# Patient Record
Sex: Female | Born: 1980 | Hispanic: No | Marital: Single | Smoking: Never smoker
Health system: Southern US, Community
[De-identification: ages and names within clinical notes are randomized; demographics above are authoritative.]

---

## 2004-02-10 ENCOUNTER — Emergency Department (HOSPITAL_COMMUNITY): Admission: EM | Admit: 2004-02-10 | Discharge: 2004-02-11 | Payer: Self-pay | Admitting: Emergency Medicine

## 2004-02-13 ENCOUNTER — Encounter: Admission: RE | Admit: 2004-02-13 | Discharge: 2004-05-13 | Payer: Self-pay | Admitting: General Practice
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
## 2007-04-08 ENCOUNTER — Emergency Department (HOSPITAL_COMMUNITY): Admission: EM | Admit: 2007-04-08 | Discharge: 2007-04-08 | Payer: Self-pay | Admitting: Emergency Medicine

## 2008-07-08 ENCOUNTER — Inpatient Hospital Stay (HOSPITAL_COMMUNITY): Admission: AD | Admit: 2008-07-08 | Discharge: 2008-07-08 | Payer: Self-pay | Admitting: Obstetrics and Gynecology

## 2008-07-10 ENCOUNTER — Inpatient Hospital Stay (HOSPITAL_COMMUNITY): Admission: AD | Admit: 2008-07-10 | Discharge: 2008-07-13 | Payer: Self-pay | Admitting: Obstetrics and Gynecology

## 2011-03-23 NOTE — Discharge Summary (Signed)
Sherry Meza, Sherry Meza               ACCOUNT NO.:  [id number]   MEDICAL RECORD NO.:  [id number]          PATIENT TYPE:  INP   LOCATION:  9101                          FACILITY:  [facility]   PHYSICIAN:  Guy Sandifer. Henderson Cloud, M.D. DATE OF BIRTH:  30-Oct-1981   DATE OF ADMISSION:  07/10/2008  DATE OF DISCHARGE:  07/13/2008                               DISCHARGE SUMMARY   ADMITTING DIAGNOSIS:  Intrauterine pregnancy at 40-2/7th weeks estimated  gestational age.   DISCHARGE DIAGNOSIS:  Intrauterine pregnancy at 40-2/7th weeks estimated  gestational age, delivered.   PROCEDURE:  On July 10, 2008 low transverse cesarean section.   REASON FOR ADMISSION:  This patient is a 30 year old G1, P0 at 40-2/7th  weeks, who was admitted in labor on July 10, 2008.  Diagnosis of  failure to progress is made and decision to take her to the operating  room is discussed.   HOSPITAL COURSE:  The patient is admitted to the hospital and undergoes  low transverse cesarean section.  This is productive of viable female  infant, Apgars of 8 at 9 minutes and 1 at 5 minutes respectively with an  arterial cord pH of 7.34 and the birth weight of 8 pounds 12 ounces.  On  the first postoperative day, she is afebrile with stable vital signs.  Not yet passing flatus.  Abdomen is soft and nontender.  Hemoglobin is  11.9 and pathology is pending.  She progresses to ambulation passing  flatus and tolerating regular diet.  Vital signs remained stable and she  remains afebrile.   CONDITION ON DISCHARGE:  Good.   DIET:  Regular as tolerated.   ACTIVITY:  No lifting, no operation of automobiles, and no vaginal  entry.  She is to call the office for problems including but not limited  to temperature of 101 degrees, persistent nausea or vomiting, and heavy  bleeding.   MEDICATIONS:  1. Percocet 5/325 mg, #30 1-2 p.o. q.6 h. p.r.n.  2. Ibuprofen 600 mg q.6 h. p.r.n.  3. Prenatal vitamins daily.  4. Follow up in the office  in 2 weeks.      Guy Sandifer Henderson Cloud, M.D.  Electronically Signed     JET/MEDQ  D:  07/13/2008  T:  07/13/2008  Job:  [id number]

## 2011-03-23 NOTE — Op Note (Signed)
Sherry Meza, Sherry Meza               ACCOUNT NO.:  [id number]   MEDICAL RECORD NO.:  [id number]          PATIENT TYPE:  INP   LOCATION:  9101                          [facility]ILITY[facility]   PHYSICIAN:  Dineen Kid. Rana Snare, M.D.    DATE OF BIRTH:  Mar 28, 1981   DATE OF PROCEDURE:  DATE OF DISCHARGE:                               OPERATIVE REPORT   PREOPERATIVE DIAGNOSES:  1. Intrauterine pregnancy at 40 and 2/7th weeks.  2. Fetal intolerance to labor.  3. Failure to progress.   POSTOPERATIVE DIAGNOSES:  1. Intrauterine pregnancy at 40 and 2/7th weeks.  2. Fetal intolerance to labor.  3. Failure to progress.  4. Macrosomia.   PROCEDURES:  Primary low-segment transverse cesarean section.   SURGEON:  Dineen Kid. Rana Snare, MD   ANESTHESIA:  Epidural.   INDICATIONS:  Ms. Lahmann presents today in early labor.  She progressed  to 6 cm, had no further dilation beyond 6 cm.  After placing  intrauterine pressure catheter, was only having 150-180 [place] units  of pressure; however, began experiencing fetal tachycardia, decreased  beat-to-beat, and moderate variables responded to discontinuing the  Pitocin, which was used for augmentation.  Baby responded appropriately;  however, made no further progress.  Whenever we began Pitocin continued  to have B cells.  Because of failure to progress and fetal intolerance  to labor, planned primary low-segment transverse cesarean section.  Risks and benefits were discussed.  Informed consent was obtained.   FINDINGS AT THE TIME OF SURGERY:  Viable female infant, Apgars were 8  and 9, pH arterial 7.34, weight is 8 pounds 12 ounces.  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   DESCRIPTION OF PROCEDURE:  After adequate analgesia, the patient placed  in the supine position with left lateral tilt.  She is sterilely prepped  and draped.  Bladder sterilely drained with a Foley catheter.  A  Pfannenstiel skin incision was made 2 fingerbreadths above the pubic  symphysis, taken down sharply to the fascia, which was incised  transversely and extended superiorly and inferiorly off the bellies  rectus muscle, which was separated sharply in midline.  Peritoneum was  entered sharply.  Bladder flap created and placed behind the bladder  blade.  Low-segment myotomy incision was made down to the infant's  vertex and extended laterally with the operator's fingertips.  Infant's  vertex was delivered atraumatically.  The nares and pharynx was  suctioned.  Nuchal cord x1 was reduced.  The infant was then delivered,  cord clamped, cut, and handed to pediatrician for resuscitation.  Cord  blood was obtained.  Placenta extracted manually.  The uterus was  exteriorized, wiped and clean with a dry lap.  The myotomy incision  closed in two layers, first being a running locking layer, second being  imbricating layer of 0 Monocryl suture.  The uterus was placed back into  the peritoneal cavity and after copious amount of irrigation and  adequate hemostasis was assured, the peritoneum was closed with 0  Monocryl.  Rectus muscle plicated to the midline with 0 Monocryl suture.  Irrigation was applied and  after adequate hemostasis, the fascia was  then closed with a #1 Vicryl in running fashion.  Irrigation was applied  and after adequate hemostasis, skin staples and Steri-Strips applied.  The patient tolerated procedure well, was stable on transfer to recovery  room.  Sponge count was normal x3.  Estimated blood loss 500 mL.  The  patient received 1 g of cefotetan after delivery of placenta.      Dineen Kid Rana Snare, M.D.  Electronically Signed     DCL/MEDQ  D:  07/11/2008  T:  07/11/2008  Job:  [id number]

## 2011-08-11 LAB — CBC
HCT: 40.2
Hemoglobin: 13.4
MCHC: 32.9
MCHC: 33.4
MCV: 93.3
RBC: 3.86 — ABNORMAL LOW
RBC: 4.37
RDW: 14
WBC: 9.3

## 2011-10-06 ENCOUNTER — Other Ambulatory Visit: Payer: Self-pay | Admitting: Otolaryngology

## 2011-10-06 DIAGNOSIS — H905 Unspecified sensorineural hearing loss: Secondary | ICD-10-CM

## 2011-10-11 ENCOUNTER — Ambulatory Visit
Admission: RE | Admit: 2011-10-11 | Discharge: 2011-10-11 | Disposition: A | Discharge status: Home / Self Care | Payer: Private Health Insurance - Indemnity | Source: Ambulatory Visit | Attending: Otolaryngology | Admitting: Otolaryngology

## 2011-10-11 DIAGNOSIS — H905 Unspecified sensorineural hearing loss: Secondary | ICD-10-CM

## 2011-10-11 MED ORDER — GADOBENATE DIMEGLUMINE 529 MG/ML IV SOLN
10.0000 mL | Freq: Once | INTRAVENOUS | Status: AC | PRN
  Administered 2011-10-11: 10 mL via INTRAVENOUS

## 2014-09-03 ENCOUNTER — Other Ambulatory Visit: Payer: Self-pay | Admitting: Obstetrics and Gynecology

## 2014-09-03 DIAGNOSIS — N644 Mastodynia: Secondary | ICD-10-CM

## 2014-09-12 ENCOUNTER — Inpatient Hospital Stay: Admission: RE | Admit: 2014-09-12 | Payer: Private Health Insurance - Indemnity | Source: Ambulatory Visit

## 2020-11-08 HISTORY — PX: OTHER SURGICAL HISTORY: SHX169

## 2021-12-03 ENCOUNTER — Other Ambulatory Visit: Payer: Self-pay

## 2021-12-03 ENCOUNTER — Other Ambulatory Visit: Payer: 59 | Admitting: Internal Medicine

## 2021-12-03 DIAGNOSIS — Z0000 Encounter for general adult medical examination without abnormal findings: Secondary | ICD-10-CM

## 2021-12-03 DIAGNOSIS — Z13220 Encounter for screening for lipoid disorders: Secondary | ICD-10-CM

## 2021-12-03 LAB — CBC WITH DIFFERENTIAL/PLATELET
Absolute Monocytes: 600 cells/uL (ref 200–950)
Basophils Absolute: 12 cells/uL (ref 0–200)
Basophils Relative: 0.2 %
Eosinophils Absolute: 180 cells/uL (ref 15–500)
Eosinophils Relative: 3 %
HCT: 39.3 % (ref 35.0–45.0)
Hemoglobin: 12.8 g/dL (ref 11.7–15.5)
Lymphs Abs: 3222 cells/uL (ref 850–3900)
MCH: 29 pg (ref 27.0–33.0)
MCHC: 32.6 g/dL (ref 32.0–36.0)
MCV: 89.1 fL (ref 80.0–100.0)
MPV: 12.2 fL (ref 7.5–12.5)
Monocytes Relative: 10 %
Neutro Abs: 1986 cells/uL (ref 1500–7800)
Neutrophils Relative %: 33.1 %
Platelets: 252 10*3/uL (ref 140–400)
RBC: 4.41 10*6/uL (ref 3.80–5.10)
RDW: 12.7 % (ref 11.0–15.0)
Total Lymphocyte: 53.7 %
WBC: 6 10*3/uL (ref 3.8–10.8)

## 2021-12-03 LAB — COMPLETE METABOLIC PANEL WITH GFR
AG Ratio: 1.4 (calc) (ref 1.0–2.5)
ALT: 31 U/L — ABNORMAL HIGH (ref 6–29)
AST: 23 U/L (ref 10–30)
Albumin: 4 g/dL (ref 3.6–5.1)
Alkaline phosphatase (APISO): 53 U/L (ref 31–125)
BUN: 7 mg/dL (ref 7–25)
CO2: 29 mmol/L (ref 20–32)
Calcium: 9.1 mg/dL (ref 8.6–10.2)
Chloride: 105 mmol/L (ref 98–110)
Creat: 0.71 mg/dL (ref 0.50–0.99)
Globulin: 2.9 g/dL (calc) (ref 1.9–3.7)
Glucose, Bld: 78 mg/dL (ref 65–99)
Potassium: 4.4 mmol/L (ref 3.5–5.3)
Sodium: 140 mmol/L (ref 135–146)
Total Bilirubin: 0.8 mg/dL (ref 0.2–1.2)
Total Protein: 6.9 g/dL (ref 6.1–8.1)
eGFR: 110 mL/min/{1.73_m2} (ref >=60)

## 2021-12-03 LAB — LIPID PANEL
Cholesterol: 187 mg/dL (ref <200)
HDL: 50 mg/dL (ref >=50)
LDL Cholesterol (Calc): 114 mg/dL (calc) — ABNORMAL HIGH
Non-HDL Cholesterol (Calc): 137 mg/dL (calc) — ABNORMAL HIGH (ref <130)
Total CHOL/HDL Ratio: 3.7 (calc) (ref <5.0)
Triglycerides: 118 mg/dL (ref <150)

## 2021-12-10 ENCOUNTER — Encounter: Payer: Self-pay | Admitting: Internal Medicine

## 2021-12-10 ENCOUNTER — Ambulatory Visit: Payer: 59 | Admitting: Internal Medicine

## 2021-12-10 ENCOUNTER — Other Ambulatory Visit: Payer: Self-pay

## 2021-12-10 VITALS — BP 128/90 | HR 84 | Temp 97.8°F | Ht 60.0 in | Wt 150.5 lb

## 2021-12-10 DIAGNOSIS — Z0000 Encounter for general adult medical examination without abnormal findings: Secondary | ICD-10-CM | POA: Diagnosis not present

## 2021-12-10 DIAGNOSIS — I493 Ventricular premature depolarization: Secondary | ICD-10-CM

## 2021-12-10 DIAGNOSIS — Z23 Encounter for immunization: Secondary | ICD-10-CM | POA: Diagnosis not present

## 2021-12-10 DIAGNOSIS — Z8249 Family history of ischemic heart disease and other diseases of the circulatory system: Secondary | ICD-10-CM

## 2021-12-10 DIAGNOSIS — Z6829 Body mass index (BMI) 29.0-29.9, adult: Secondary | ICD-10-CM

## 2021-12-10 NOTE — Patient Instructions (Addendum)
Tetanus immunization update given today.    I have noticed a slight irregular heartbeat on cardiac exam.  We have ordered a Holter monitor through [facility] on [facility] in   [place].  They should contact you about this in the next 3 weeks.  Continue your regular follow-up with OB/GYN. I suspect these are infrequent PVCs which can be caused by decongestants, caffeine and stress.  It was a pleasure to see you today.  Patient appeared to have infrequent PVCs on cardiac monitor in office but EKG did not capture this today.
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
## 2022-02-01 ENCOUNTER — Ambulatory Visit (INDEPENDENT_AMBULATORY_CARE_PROVIDER_SITE_OTHER): Payer: 59

## 2022-02-01 ENCOUNTER — Other Ambulatory Visit: Payer: Self-pay

## 2022-02-01 ENCOUNTER — Other Ambulatory Visit: Payer: Self-pay | Admitting: Internal Medicine

## 2022-02-01 DIAGNOSIS — I493 Ventricular premature depolarization: Secondary | ICD-10-CM

## 2022-02-01 DIAGNOSIS — I499 Cardiac arrhythmia, unspecified: Secondary | ICD-10-CM

## 2022-02-01 NOTE — Progress Notes (Unsigned)
Enrolled for [employer] to mail a ZIO XT long term holter monitor to the patients address on file.  

## 2022-02-04 DIAGNOSIS — I499 Cardiac arrhythmia, unspecified: Secondary | ICD-10-CM | POA: Diagnosis not present

## 2022-02-04 DIAGNOSIS — I493 Ventricular premature depolarization: Secondary | ICD-10-CM | POA: Diagnosis not present

## 2022-03-09 ENCOUNTER — Encounter: Payer: Self-pay | Admitting: Internal Medicine

## 2022-03-09 ENCOUNTER — Telehealth: Payer: Self-pay | Admitting: Internal Medicine

## 2022-03-09 ENCOUNTER — Ambulatory Visit: Payer: 59 | Admitting: Internal Medicine

## 2022-03-09 VITALS — BP 112/88 | HR 81 | Temp 98.4°F | Ht 60.0 in | Wt 151.2 lb

## 2022-03-09 DIAGNOSIS — Z8249 Family history of ischemic heart disease and other diseases of the circulatory system: Secondary | ICD-10-CM | POA: Diagnosis not present

## 2022-03-09 DIAGNOSIS — R5383 Other fatigue: Secondary | ICD-10-CM | POA: Diagnosis not present

## 2022-03-09 NOTE — Telephone Encounter (Signed)
-----   Message from Jama Flavors, CMA sent at 03/09/2022  9:36 AM EDT ----- ?Please get scheduled. ? ?----- Message ----- ?From: Margaree Mackintosh, MD ?Sent: 03/02/2022   3:53 PM EDT ?To: Jama Flavors, CMA ? ?Needs OV to go over  ZIO monitor results ?----- Message ----- ?From: Theresia Majors, RN ?Sent: 03/02/2022   7:32 AM EDT ?To: Margaree Mackintosh, MD ? ? ? ?

## 2022-03-09 NOTE — Patient Instructions (Signed)
Referral to Cardiology regarding PVCs, bigeminy and trigeminy.  She has an appointment tomorrow.  Free T4 and TSH drawn today. ?

## 2022-03-09 NOTE — Telephone Encounter (Signed)
LVM to CB and schedule appointment 

## 2022-03-09 NOTE — Progress Notes (Signed)
? ?  Subjective:  ? ? Patient ID: Sherry Meza, female    DOB: 1981-06-02, 41 y.o.   MRN: [id number] ? ?HPI 41 year old Female seen regarding Zio monitor results.  She presented to the office for the first 10 on February 2 for health maintenance exam. ? ?On February 2, she was noted to have arrhythmia on cardiac exam.  EKG showed intraventricular conduction delay.  She is asymptomatic.  Occasionally aware of palpitations but does not affect her activities.  Denies chest pain. ? ?A Zio monitor was ordered showing 1 run of V. tach of 5 beats.  9 supraventricular tachycardia runs the longest lasting 1 minute 3 seconds.  Rare PACs.  Rare couplets.  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 ? ?Her general health is excellent.  She is a non-smoker.  Social alcohol consumption.  No known drug allergies.  Had C-section in 2009 by Dr. Julien Girt and a laparoscopic myomectomy for fibroids in [place] in October 2022. ? ?Social history: She is a native of [place].  She came to this country in 1996 when she was 41 years old.  She is single.  Completed 4 years of college.   Resides with teenage daughter.  She works for [employer]. ? ?Family history: Father has history of ventricular tachycardia and is seen in [facility] by Dr. Johney Frame.  He also has history of diabetes, hyperlipidemia, chronic kidney disease and hypertension. ? ? ? ?Review of Systems see above-denies chest pain or shortness of breath.  Occasionally will feel the palpitations but not always. ? ?   ?Objective:  ? Physical Exam ? ? BP 112/88, pulse 81, T 98.4 degrees, pulse ox 99% ? ?Skin is warm and dry.  Chest clear to auscultation.  Cardiac exam reveals mostly regular rate and rhythm but will have extrasystoles.  No murmur is present. ? ? ?   ?Assessment & Plan:  ?Episodes of bigeminy and trigeminy with family history of V. tach in her father ? ?Plan: Patient will be referred to Cardiology for evaluation and treatment.  Free T4 and TSH drawn. ? ?

## 2022-03-09 NOTE — Telephone Encounter (Signed)
CB and scheduled 

## 2022-03-10 ENCOUNTER — Encounter: Payer: Self-pay | Admitting: Internal Medicine

## 2022-03-10 ENCOUNTER — Ambulatory Visit (INDEPENDENT_AMBULATORY_CARE_PROVIDER_SITE_OTHER): Payer: 59 | Admitting: Internal Medicine

## 2022-03-10 VITALS — BP 116/72 | HR 74 | Ht 60.0 in | Wt 152.6 lb

## 2022-03-10 DIAGNOSIS — I493 Ventricular premature depolarization: Secondary | ICD-10-CM | POA: Diagnosis not present

## 2022-03-10 DIAGNOSIS — I498 Other specified cardiac arrhythmias: Secondary | ICD-10-CM | POA: Diagnosis not present

## 2022-03-10 LAB — TSH: TSH: 2.13 mIU/L

## 2022-03-10 LAB — T4, FREE: Free T4: 1 ng/dL (ref 0.8–1.8)

## 2022-03-10 NOTE — Patient Instructions (Signed)
Medication Instructions:  ?No Changes In Medications at this time.  ?*If you need a refill on your cardiac medications before your next appointment, please call your pharmacy* ? ?Lab Work: ?None Ordered At This Time.  ?If you have labs (blood work) drawn today and your tests are completely normal, you will receive your results only by: ?MyChart Message (if you have MyChart) OR ?A paper copy in the mail ?If you have any lab test that is abnormal or we need to change your treatment, we will call you to review the results. ? ?Testing/Procedures: ?Your physician has requested that you have an echocardiogram. Echocardiography is a painless test that uses sound waves to create images of your heart. It provides your doctor with information about the size and shape of your heart and how well your heart?s chambers and valves are working. You may receive an ultrasound enhancing agent through an IV if needed to better visualize your heart during the echo.This procedure takes approximately one hour. There are no restrictions for this procedure. This will take place at the [address].  ? ?Follow-Up: ?At [facility], you and your health needs are our priority.  As part of our continuing mission to provide you with exceptional heart care, we have created designated Provider Care Teams.  These Care Teams include your primary Cardiologist (physician) and Advanced Practice Providers (APPs -  Physician Assistants and Nurse Practitioners) who all work together to provide you with the care you need, when you need it. ? ?Your next appointment:   ?3 month(s) ? ?The format for your next appointment:   ?In Person ? ?Provider:   ?Janina Mayo, MD   ? ? ? ? ?  ?

## 2022-03-10 NOTE — Progress Notes (Signed)
?Cardiology Office Note:   ? ?Date:  03/10/2022  ? ?ID:  Sherry Meza, DOB 02-21-1981, MRN [id number] ? ?PCP:  Margaree Mackintosh, MD ?  ?[facility] Providers ?Cardiologist:  Maisie Fus, MD    ? ?Referring MD: Margaree Mackintosh, MD  ? ?No chief complaint on file. ?Ectopy ? ?History of Present Illness:   ? ?Sherry Meza is a 41 y.o. female with a hx of ectopy ? ?Had a ziopatch that showed rare bigeminy and trigeminy. She had many triggered events that were predominantly sinus. EKG in February was normal. Father had Vtach? ; unknown etiology. She had 1 run of NSVT ? ?She denies chest pressure or SOB. Occasional sharp breast pain. She has occasional LH. No syncope. Normal pregnancy. ? ?She says her father has heart issues. Unknown hx. MGM has heart disease. Her cousin  had heart surgery at birth. No SCD.  ? ?TSH normal ? ?Blood pressures is normal ? ? ?Past Surgical History:  ?Procedure Laterality Date  ? CESAREAN SECTION  2009  ? uterine fibroid removed  2022  ? ? ?Current Medications: ?Current Meds  ?Medication Sig  ? Ascorbic Acid (VITAMIN C PO) Take 1 tablet by mouth daily.  ? Multiple Vitamin (MULTIVITAMIN) tablet Take 1 tablet by mouth daily.  ? NON FORMULARY Prenatal with iron  ?  ? ?Allergies:   Patient has no known allergies.  ? ?Social History  ? ?Socioeconomic History  ? Marital status: Single  ?  Spouse name: Not on file  ? Number of children: Not on file  ? Years of education: Not on file  ? Highest education level: Not on file  ?Occupational History  ? Not on file  ?Tobacco Use  ? Smoking status: Never  ? Smokeless tobacco: Never  ?Substance and Sexual Activity  ? Alcohol use: Not on file  ? Drug use: Not on file  ? Sexual activity: Not on file  ?Other Topics Concern  ? Not on file  ?Social History Narrative  ? Not on file  ? ?Social Determinants of Health  ? ?Financial Resource Strain: Not on file  ?Food Insecurity: Not on file  ?Transportation Needs: Not on file  ?Physical Activity: Not on file   ?Stress: Not on file  ?Social Connections: Not on file  ?  ? ?Family History: ?The patient's family history includes Diabetes in her father; Hypertension in her father. ? ?ROS:   ?Please see the history of present illness.    ? All other systems reviewed and are negative. ? ?EKGs/Labs/Other Studies Reviewed:   ? ?The following studies were reviewed today: ? ? ?EKG:  EKG is  ordered today.  The ekg ordered today demonstrates  ? ?5/3-NSR, no ectopy ? ?Recent Labs: ?12/03/2021: ALT 31; BUN 7; Creat 0.71; Hemoglobin 12.8; Platelets 252; Potassium 4.4; Sodium 140 ?03/09/2022: TSH 2.13  ?Recent Lipid Panel ?   ?Component Value Date/Time  ? CHOL 187 12/03/2021 0938  ? TRIG 118 12/03/2021 0938  ? HDL 50 12/03/2021 0938  ? CHOLHDL 3.7 12/03/2021 0938  ? LDLCALC 114 (H) 12/03/2021 0263  ? ? ? ?Risk Assessment/Calculations:   ?  ? ?    ? ?Physical Exam:   ? ?VS: ? ?Vitals:  ? 03/10/22 0828  ?BP: 116/72  ?Pulse: 74  ?SpO2: 99%  ? ? ? ?Wt Readings from Last 3 Encounters:  ?03/10/22 152 lb 9.6 oz (69.2 kg)  ?03/09/22 151 lb 4 oz (68.6 kg)  ?12/10/21 150 lb 8 oz (68.3 kg)  ?  ? ?  GEN:  Well nourished, well developed in no acute distress ?HEENT: Normal ?NECK: No JVD; No carotid bruits ?LYMPHATICS: No lymphadenopathy ?CARDIAC: RRR, no murmurs, rubs, gallops ?RESPIRATORY:  Clear to auscultation without rales, wheezing or rhonchi  ?ABDOMEN: Soft, non-tender, non-distended ?MUSCULOSKELETAL:  No edema; No deformity  ?SKIN: Warm and dry ?NEUROLOGIC:  Alert and oriented x 3 ?PSYCHIATRIC:  Normal affect  ? ?ASSESSMENT:   ? ?Ectopy: she is low risk for ischemic disease and symptoms atypical. Father's heart disease is unknown. EKG is unremarkable. Her ectopy was minimal. Recommended increased consumption of electrolytes. Otherwise, will obtain a TTE to ensure she has no structural heart disease. ? ?PLAN:   ? ?In order of problems listed above: ? ?TTE ?Follow up in 3 months ? ?   ? ?  ?Medication Adjustments/Labs and Tests Ordered: ?Current  medicines are reviewed at length with the patient today.  Concerns regarding medicines are outlined above.  ?Orders Placed This Encounter  ?Procedures  ? EKG 12-Lead  ? ECHOCARDIOGRAM COMPLETE  ? ?No orders of the defined types were placed in this encounter. ? ? ?Patient Instructions  ?Medication Instructions:  ?No Changes In Medications at this time.  ?*If you need a refill on your cardiac medications before your next appointment, please call your pharmacy* ? ?Lab Work: ?None Ordered At This Time.  ?If you have labs (blood work) drawn today and your tests are completely normal, you will receive your results only by: ?MyChart Message (if you have MyChart) OR ?A paper copy in the mail ?If you have any lab test that is abnormal or we need to change your treatment, we will call you to review the results. ? ?Testing/Procedures: ?Your physician has requested that you have an echocardiogram. Echocardiography is a painless test that uses sound waves to create images of your heart. It provides your doctor with information about the size and shape of your heart and how well your heart?s chambers and valves are working. You may receive an ultrasound enhancing agent through an IV if needed to better visualize your heart during the echo.This procedure takes approximately one hour. There are no restrictions for this procedure. This will take place at the [address], [address].  ? ?Follow-Up: ?At [facility], you and your health needs are our priority.  As part of our continuing mission to provide you with exceptional heart care, we have created designated Provider Care Teams.  These Care Teams include your primary Cardiologist (physician) and Advanced Practice Providers (APPs -  Physician Assistants and Nurse Practitioners) who all work together to provide you with the care you need, when you need it. ? ?Your next appointment:   ?3 month(s) ? ?The format for your next appointment:   ?In Person ? ?Provider:   ?Maisie Fus, MD   ? ? ? ? ?   ? ?Signed, ?Maisie Fus, MD  ?03/10/2022 8:52 AM    ?[facility] ?

## 2022-03-24 ENCOUNTER — Ambulatory Visit (HOSPITAL_COMMUNITY): Payer: 59 | Attending: Cardiology

## 2022-03-24 DIAGNOSIS — I493 Ventricular premature depolarization: Secondary | ICD-10-CM | POA: Diagnosis not present

## 2022-03-24 DIAGNOSIS — I358 Other nonrheumatic aortic valve disorders: Secondary | ICD-10-CM | POA: Diagnosis not present

## 2022-03-24 DIAGNOSIS — I498 Other specified cardiac arrhythmias: Secondary | ICD-10-CM | POA: Diagnosis not present

## 2022-03-24 LAB — ECHOCARDIOGRAM COMPLETE
Area-P 1/2: 4.13 cm2
S' Lateral: 2.3 cm

## 2022-06-15 NOTE — Progress Notes (Deleted)
Cardiology Office Note:    Date:  06/15/2022   ID:  [name], [name] Aug 29, 1981, MRN [id number]  PCP:  Margaree Mackintosh, MD   [facility] Providers Cardiologist:  Maisie Fus, MD     Referring MD: Margaree Mackintosh, MD   No chief complaint on file. Ectopy  History of Present Illness:    Sherry Meza is a 41 y.o. female with a hx of ectopy  Had a ziopatch that showed rare bigeminy and trigeminy. She had many triggered events that were predominantly sinus. EKG in February was normal. Father had Vtach? ; unknown etiology. She had 1 run of NSVT  She denies chest pressure or SOB. Occasional sharp breast pain. She has occasional LH. No syncope. Normal pregnancy.  She says her father has heart issues. Unknown hx. MGM has heart disease. Her cousin  had heart surgery at birth. No SCD.   TSH normal  Blood pressures is normal  Interim Hx 8/9: Sherry Meza underwent an echo which showed normal EF and strain. No valve disease. No pulmonary HTN.   Past Surgical History:  Procedure Laterality Date   CESAREAN SECTION  2009   uterine fibroid removed  2022    Current Medications: No outpatient medications have been marked as taking for the 06/16/22 encounter (Appointment) with Maisie Fus, MD.     Allergies:   Patient has no known allergies.   Social History   Socioeconomic History   Marital status: Single    Spouse name: Not on file   Number of children: Not on file   Years of education: Not on file   Highest education level: Not on file  Occupational History   Not on file  Tobacco Use   Smoking status: Never   Smokeless tobacco: Never  Substance and Sexual Activity   Alcohol use: Not on file   Drug use: Not on file   Sexual activity: Not on file  Other Topics Concern   Not on file  Social History Narrative   Not on file   Social Determinants of Health   Financial Resource Strain: Not on file  Food Insecurity: Not on file  Transportation Needs: Not on file   Physical Activity: Not on file  Stress: Not on file  Social Connections: Not on file     Family History: The patient's family history includes Diabetes in her father; Hypertension in her father.  ROS:   Please see the history of present illness.     All other systems reviewed and are negative.  EKGs/Labs/Other Studies Reviewed:    The following studies were reviewed today:   EKG:  EKG is  ordered today.  The ekg ordered today demonstrates   5/3-NSR, no ectopy  Recent Labs: 12/03/2021: ALT 31; BUN 7; Creat 0.71; Hemoglobin 12.8; Platelets 252; Potassium 4.4; Sodium 140 03/09/2022: TSH 2.13  Recent Lipid Panel    Component Value Date/Time   CHOL 187 12/03/2021 0938   TRIG 118 12/03/2021 0938   HDL 50 12/03/2021 0938   CHOLHDL 3.7 12/03/2021 0938   LDLCALC 114 (H) 12/03/2021 0938     Risk Assessment/Calculations:           Physical Exam:    VS:  There were no vitals filed for this visit.    Wt Readings from Last 3 Encounters:  03/10/22 152 lb 9.6 oz (69.2 kg)  03/09/22 151 lb 4 oz (68.6 kg)  12/10/21 150 lb 8 oz (68.3 kg)     GEN:  Well nourished, well developed in no acute distress HEENT: Normal NECK: No JVD; No carotid bruits LYMPHATICS: No lymphadenopathy CARDIAC: RRR, no murmurs, rubs, gallops RESPIRATORY:  Clear to auscultation without rales, wheezing or rhonchi  ABDOMEN: Soft, non-tender, non-distended MUSCULOSKELETAL:  No edema; No deformity  SKIN: Warm and dry NEUROLOGIC:  Alert and oriented x 3 PSYCHIATRIC:  Normal affect   ASSESSMENT:    Ectopy: she is low risk for ischemic disease and symptoms atypical. Father's heart disease is unknown. EKG is unremarkable. Her ectopy was minimal. Recommended increased consumption of electrolytes. Otherwise, will obtain a TTE to ensure she has no structural heart disease.  PLAN:    In order of problems listed above:  Follow up as needed ***        Medication Adjustments/Labs and Tests  Ordered: Current medicines are reviewed at length with the patient today.  Concerns regarding medicines are outlined above.  No orders of the defined types were placed in this encounter.  No orders of the defined types were placed in this encounter.   There are no Patient Instructions on file for this visit.   Signed, Maisie Fus, MD  06/15/2022 9:00 AM    [facility]

## 2022-06-16 ENCOUNTER — Ambulatory Visit: Payer: 59 | Admitting: Internal Medicine

## 2022-06-28 NOTE — Progress Notes (Unsigned)
Cardiology Office Note:    Date:  06/29/2022   ID:  Sherry, Meza [birth year], MRN [id number]  PCP:  Margaree Mackintosh, MD   [facility] Providers Cardiologist:  Maisie Fus, MD     Referring MD: Margaree Mackintosh, MD   No chief complaint on file. Ectopy  History of Present Illness:    Sherry Meza is a 41 y.o. female with a hx of ectopy  Had a ziopatch that showed rare bigeminy and trigeminy. She had many triggered events that were predominantly sinus. EKG in February was normal. Father had Vtach? ; unknown etiology. She had 1 run of NSVT  She denies chest pressure or SOB. Occasional sharp breast pain. She has occasional LH. No syncope. Normal pregnancy.  She says her father has heart issues. Unknown hx. MGM has heart disease. Her cousin  had heart surgery at birth. No SCD.   TSH normal  Blood pressures is normal  Interim Hx 06/29/2022: [name] underwent an echo which showed normal EF and strain. No valve disease. No pulmonary HTN. Zio showed minimal NSVT. Runs of PACs. She denies symptoms.    Past Surgical History:  Procedure Laterality Date   CESAREAN SECTION  2009   uterine fibroid removed  2022    Current Medications: Current Meds  Medication Sig   Ascorbic Acid (VITAMIN C PO) Take 1 tablet by mouth daily.   Multiple Vitamin (MULTIVITAMIN) tablet Take 1 tablet by mouth daily.   NON FORMULARY Prenatal with iron     Allergies:   Patient has no known allergies.   Social History   Socioeconomic History   Marital status: Single    Spouse name: Not on file   Number of children: Not on file   Years of education: Not on file   Highest education level: Not on file  Occupational History   Not on file  Tobacco Use   Smoking status: Never   Smokeless tobacco: Never  Substance and Sexual Activity   Alcohol use: Not on file   Drug use: Not on file   Sexual activity: Not on file  Other Topics Concern   Not on file  Social History Narrative   Not on  file   Social Determinants of Health   Financial Resource Strain: Not on file  Food Insecurity: Not on file  Transportation Needs: Not on file  Physical Activity: Not on file  Stress: Not on file  Social Connections: Not on file     Family History: The patient's family history includes Diabetes in her father; Hypertension in her father.  ROS:   Please see the history of present illness.     All other systems reviewed and are negative.  EKGs/Labs/Other Studies Reviewed:    The following studies were reviewed today:   EKG:  EKG is  ordered today.  The ekg ordered today demonstrates   5/3-NSR, no ectopy  Recent Labs: 12/03/2021: ALT 31; BUN 7; Creat 0.71; Hemoglobin 12.8; Platelets 252; Potassium 4.4; Sodium 140 03/09/2022: TSH 2.13  Recent Lipid Panel    Component Value Date/Time   CHOL 187 12/03/2021 0938   TRIG 118 12/03/2021 0938   HDL 50 12/03/2021 0938   CHOLHDL 3.7 12/03/2021 0938   LDLCALC 114 (H) 12/03/2021 0938     Risk Assessment/Calculations:           Physical Exam:    VS:  Vitals:   06/29/22 0857  BP: 138/86  Pulse: 86  SpO2: 98%  Wt Readings from Last 3 Encounters:  06/29/22 149 lb 12.8 oz (67.9 kg)  03/10/22 152 lb 9.6 oz (69.2 kg)  03/09/22 151 lb 4 oz (68.6 kg)     GEN:  Well nourished, well developed in no acute distress HEENT: Normal NECK: No JVD;  LYMPHATICS: No lymphadenopathy CARDIAC: RRR, no murmurs, rubs, gallops RESPIRATORY:  Clear to auscultation without rales, wheezing or rhonchi  ABDOMEN: Soft, non-tender, non-distended MUSCULOSKELETAL:  No edema; No deformity  SKIN: Warm and dry NEUROLOGIC:  Alert and oriented x 3 PSYCHIATRIC:  Normal affect   ASSESSMENT:    Ectopy: she is low risk for ischemic disease and symptoms atypical. Father's heart disease is unknown. EKG is unremarkable. Her ectopy was minimal. Recommended increased consumption of electrolytes.  Can decrease caffeine. TTE was normal. Zio did not show  any concerning arrhythmia  PLAN:    In order of problems listed above:  No further cardiac w/u indicated at this time    Medication Adjustments/Labs and Tests Ordered: Current medicines are reviewed at length with the patient today.  Concerns regarding medicines are outlined above.  No orders of the defined types were placed in this encounter.  No orders of the defined types were placed in this encounter.   Patient Instructions  .   Signed, Maisie Fus, MD  06/29/2022 9:10 AM    [facility]

## 2022-06-29 ENCOUNTER — Encounter: Payer: Self-pay | Admitting: Internal Medicine

## 2022-06-29 ENCOUNTER — Ambulatory Visit: Payer: 59 | Admitting: Internal Medicine

## 2022-06-29 VITALS — BP 138/86 | HR 86 | Ht 60.0 in | Wt 149.8 lb

## 2022-06-29 DIAGNOSIS — R002 Palpitations: Secondary | ICD-10-CM | POA: Diagnosis not present

## 2022-06-29 NOTE — Patient Instructions (Signed)

## 2022-07-06 ENCOUNTER — Encounter: Payer: Self-pay | Admitting: Internal Medicine

## 2022-12-09 ENCOUNTER — Other Ambulatory Visit: Payer: 59

## 2022-12-09 ENCOUNTER — Telehealth: Payer: Self-pay | Admitting: Internal Medicine

## 2022-12-09 DIAGNOSIS — Z136 Encounter for screening for cardiovascular disorders: Secondary | ICD-10-CM

## 2022-12-09 DIAGNOSIS — Z13220 Encounter for screening for lipoid disorders: Secondary | ICD-10-CM

## 2022-12-09 DIAGNOSIS — R5383 Other fatigue: Secondary | ICD-10-CM

## 2022-12-09 NOTE — Telephone Encounter (Signed)
LVM to CB about missed lab appointment

## 2022-12-09 NOTE — Telephone Encounter (Signed)
Sherry Meza called back and is going to come in for her CPE

## 2022-12-10 NOTE — Progress Notes (Signed)
Patient Care Team: Elby Showers, MD as PCP - General (Internal Medicine) Janina Mayo, MD as PCP - Cardiology (Cardiology)  Visit Date: 12/13/22  Subjective:    Patient ID: Sherry Meza , Female   DOB: 11-Jun-1981, 42 y.o.    MRN: [id number]   42 y.o. Female presents today for annual comprehensive physical exam. Patient has a past medical history of palpitations.  Reports feeling generally well. Denies any new health problems.  History of palpitations. Saw her cardiologist, Dr. Phineas Inches, in 8/23 and was reported low risk for ischemic disease, EKG normal. 14 Day Zio XT Monitor ordered 3/23 showing 1 run of V. tach of 5 beats. 9 supraventricular tachycardia runs the longest lasting 1 minute 3 seconds. Rare PACs. Rare couplets. 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 No new meds after monitor.  Status post cesarean section 2009, laparoscopic myomectomy for fibroids 2022.  Reports intermittent muscle spasms in right leg upon moving after lying down.  Takes vitamin C, Zinc, multivitamin, prenatal OTC.   Pap smear last done 04/08/21. Recommended repeat in 2025. Sees her gynecologist in 6/24.  Mammogram last completed 04/15/22, results normal. Recommended repeat in 2023. Repeat scheduled with gynecologist in 6/24.   No past medical history on file.   Family History  Problem Relation Age of Onset   Diabetes Father    Hypertension Father     Social History   Social History Narrative   She is a native of [place].  She came to this country in 1996 when she was 42 years old.  She is single.  Completed 4 years of college.   Resides with teenage daughter.  She works for [employer].      Review of Systems  Constitutional:  Negative for chills, fever, malaise/fatigue and weight loss.  HENT:  Negative for hearing loss, sinus pain and sore throat.   Respiratory:  Negative for cough and hemoptysis.   Cardiovascular:  Negative for chest pain, palpitations, leg swelling and PND.   Gastrointestinal:  Negative for abdominal pain, constipation, diarrhea, heartburn, nausea and vomiting.  Genitourinary:  Negative for dysuria, frequency and urgency.  Musculoskeletal:  Negative for back pain, myalgias and neck pain.  Skin:  Negative for itching and rash.  Neurological:  Negative for dizziness, tingling, seizures and headaches.  Endo/Heme/Allergies:  Negative for polydipsia.  Psychiatric/Behavioral:  Negative for depression. The patient is not nervous/anxious.         Objective:   Vitals: BP 130/74   Pulse 82   Temp 98.3 F (36.8 C) (Tympanic)   Ht 5' (1.524 m)   Wt 149 lb 1.9 oz (67.6 kg)   LMP 11/26/2022   SpO2 98%   BMI 29.12 kg/m    Physical Exam Vitals and nursing note reviewed. Exam conducted with a chaperone present.  Constitutional:      General: She is not in acute distress.    Appearance: Normal appearance. She is not ill-appearing or toxic-appearing.  HENT:     Head: Normocephalic and atraumatic.     Right Ear: Hearing, tympanic membrane, ear canal and external ear normal.     Left Ear: Hearing, tympanic membrane, ear canal and external ear normal.     Mouth/Throat:     Pharynx: Oropharynx is clear.  Eyes:     Extraocular Movements: Extraocular movements intact.     Pupils: Pupils are equal, round, and reactive to light.  Neck:     Thyroid: No thyroid mass, thyromegaly or thyroid tenderness.  Vascular: No carotid bruit.  Cardiovascular:     Rate and Rhythm: Normal rate and regular rhythm. No extrasystoles are present.    Pulses: Normal pulses.          Dorsalis pedis pulses are 2+ on the right side and 2+ on the left side.       Posterior tibial pulses are 2+ on the right side and 2+ on the left side.     Heart sounds: Normal heart sounds. No murmur heard.    No friction rub. No gallop.  Pulmonary:     Effort: Pulmonary effort is normal.     Breath sounds: Normal breath sounds. No decreased breath sounds, wheezing, rhonchi or rales.   Chest:     Chest wall: No mass.  Abdominal:     Palpations: Abdomen is soft. There is no hepatomegaly, splenomegaly or mass.     Tenderness: There is no abdominal tenderness.     Hernia: No hernia is present.  Musculoskeletal:     Cervical back: Normal range of motion.     Right lower leg: No edema.     Left lower leg: No edema.  Lymphadenopathy:     Cervical: No cervical adenopathy.     Upper Body:     Right upper body: No supraclavicular adenopathy.     Left upper body: No supraclavicular adenopathy.  Skin:    General: Skin is warm and dry.  Neurological:     General: No focal deficit present.     Mental Status: She is alert and oriented to person, place, and time. Mental status is at baseline.     Sensory: Sensation is intact.     Motor: Motor function is intact. No weakness.     Deep Tendon Reflexes: Reflexes are normal and symmetric.  Psychiatric:        Attention and Perception: Attention normal.        Mood and Affect: Mood normal.        Speech: Speech normal.        Behavior: Behavior normal.        Thought Content: Thought content normal.        Cognition and Memory: Cognition normal.        Judgment: Judgment normal.       Results:   Studies obtained and personally reviewed by me:  Pap smear last done 04/08/21. Recommended repeat in 2025. Sees her gynecologist in 6/24.  Mammogram last completed 04/15/22, results normal. Recommended repeat in 2023. Repeat scheduled with gynecologist in 6/24.   Labs:       Component Value Date/Time   NA 140 12/03/2021 0938   K 4.4 12/03/2021 0938   CL 105 12/03/2021 0938   CO2 29 12/03/2021 0938   GLUCOSE 78 12/03/2021 0938   BUN 7 12/03/2021 0938   CREATININE 0.71 12/03/2021 0938   CALCIUM 9.1 12/03/2021 0938   PROT 6.9 12/03/2021 0938   AST 23 12/03/2021 0938   ALT 31 (H) 12/03/2021 0938   BILITOT 0.8 12/03/2021 0938     Lab Results  Component Value Date   WBC 6.0 12/03/2021   HGB 12.8 12/03/2021   HCT 39.3  12/03/2021   MCV 89.1 12/03/2021   PLT 252 12/03/2021    Lab Results  Component Value Date   CHOL 187 12/03/2021   HDL 50 12/03/2021   LDLCALC 114 (H) 12/03/2021   TRIG 118 12/03/2021   CHOLHDL 3.7 12/03/2021    No results found for: "HGBA1C"  Lab Results  Component Value Date   TSH 2.13 03/09/2022      Assessment & Plan:   Palpitations: Saw cardiologist, Dr. Phineas Inches, in 8/23 and was reported low risk for ischemic disease, EKG normal. No new meds after Zio XT monitor. Screening for heart disease in blood work today.  Muscle Spasms: Right leg when sleeping. Discussed with Pt. No problems identified based on descriptions.  Pap smear: Last done 04/08/21. Recommended repeat in 2025. Sees her gynecologist in 6/24.  Mammogram: Last completed 04/15/22, results normal. Recommended repeat in 2023. Repeat scheduled with gynecologist in 6/24.  Vaccine Counseling: UTD on flu, tetanus vaccines. Declines Covid-19 vaccine.   Health Maintenance: Blood work ordered. Urine culture ordered.   I,Alexander Ruley,acting as a Education administrator for Elby Showers, MD.,have documented all relevant documentation on the behalf of Elby Showers, MD,as directed by  Elby Showers, MD while in the presence of Elby Showers, MD.   I, Elby Showers, MD, have reviewed all documentation for this visit. The documentation on 12/26/22 for the exam, diagnosis, procedures, and orders are all accurate and complete.

## 2022-12-13 ENCOUNTER — Other Ambulatory Visit: Payer: 59

## 2022-12-13 ENCOUNTER — Ambulatory Visit (INDEPENDENT_AMBULATORY_CARE_PROVIDER_SITE_OTHER): Payer: 59 | Admitting: Internal Medicine

## 2022-12-13 ENCOUNTER — Encounter: Payer: Self-pay | Admitting: Internal Medicine

## 2022-12-13 VITALS — BP 130/74 | HR 82 | Temp 98.3°F | Ht 60.0 in | Wt 149.1 lb

## 2022-12-13 DIAGNOSIS — Z0000 Encounter for general adult medical examination without abnormal findings: Secondary | ICD-10-CM | POA: Diagnosis not present

## 2022-12-13 DIAGNOSIS — Z6829 Body mass index (BMI) 29.0-29.9, adult: Secondary | ICD-10-CM

## 2022-12-13 DIAGNOSIS — R5383 Other fatigue: Secondary | ICD-10-CM

## 2022-12-13 DIAGNOSIS — Z8249 Family history of ischemic heart disease and other diseases of the circulatory system: Secondary | ICD-10-CM

## 2022-12-13 DIAGNOSIS — Z13220 Encounter for screening for lipoid disorders: Secondary | ICD-10-CM

## 2022-12-13 DIAGNOSIS — Z136 Encounter for screening for cardiovascular disorders: Secondary | ICD-10-CM

## 2022-12-13 LAB — POCT URINALYSIS DIPSTICK
Bilirubin, UA: NEGATIVE
Blood, UA: NEGATIVE
Glucose, UA: NEGATIVE
Ketones, UA: NEGATIVE
Leukocytes, UA: NEGATIVE
Nitrite, UA: NEGATIVE
Protein, UA: NEGATIVE
Spec Grav, UA: 1.01 (ref 1.010–1.025)
Urobilinogen, UA: 0.2 E.U./dL
pH, UA: 7.5 (ref 5.0–8.0)

## 2022-12-13 NOTE — Patient Instructions (Addendum)
It was a pleasure to see you today.  Labs are stable and within normal limits.  Palpitations have subsided and ectopy has improved.  Extensive evaluation by Dr. Harl Bowie has been performed in August 2. 2023 who felt there was no concerning arrhythmias.  Physical exam today is normal.  Return in 1 year or as needed.  Will see GYN in the near future.

## 2022-12-14 LAB — COMPLETE METABOLIC PANEL WITH GFR
AG Ratio: 1.2 (calc) (ref 1.0–2.5)
ALT: 15 U/L (ref 6–29)
AST: 17 U/L (ref 10–30)
Albumin: 4.1 g/dL (ref 3.6–5.1)
Alkaline phosphatase (APISO): 51 U/L (ref 31–125)
BUN: 8 mg/dL (ref 7–25)
CO2: 27 mmol/L (ref 20–32)
Calcium: 9.3 mg/dL (ref 8.6–10.2)
Chloride: 104 mmol/L (ref 98–110)
Creat: 0.68 mg/dL (ref 0.50–0.99)
Globulin: 3.4 g/dL (calc) (ref 1.9–3.7)
Glucose, Bld: 72 mg/dL (ref 65–99)
Potassium: 4.4 mmol/L (ref 3.5–5.3)
Sodium: 138 mmol/L (ref 135–146)
Total Bilirubin: 0.7 mg/dL (ref 0.2–1.2)
Total Protein: 7.5 g/dL (ref 6.1–8.1)
eGFR: 112 mL/min/{1.73_m2} (ref >=60)

## 2022-12-14 LAB — CBC WITH DIFFERENTIAL/PLATELET
Absolute Monocytes: 648 cells/uL (ref 200–950)
Basophils Absolute: 29 cells/uL (ref 0–200)
Basophils Relative: 0.4 %
Eosinophils Absolute: 94 cells/uL (ref 15–500)
Eosinophils Relative: 1.3 %
HCT: 38.8 % (ref 35.0–45.0)
Hemoglobin: 12.7 g/dL (ref 11.7–15.5)
Lymphs Abs: 3406 cells/uL (ref 850–3900)
MCH: 28.8 pg (ref 27.0–33.0)
MCHC: 32.7 g/dL (ref 32.0–36.0)
MCV: 88 fL (ref 80.0–100.0)
MPV: 11.3 fL (ref 7.5–12.5)
Monocytes Relative: 9 %
Neutro Abs: 3024 cells/uL (ref 1500–7800)
Neutrophils Relative %: 42 %
Platelets: 264 10*3/uL (ref 140–400)
RBC: 4.41 10*6/uL (ref 3.80–5.10)
RDW: 12.4 % (ref 11.0–15.0)
Total Lymphocyte: 47.3 %
WBC: 7.2 10*3/uL (ref 3.8–10.8)

## 2022-12-14 LAB — LIPID PANEL
Cholesterol: 199 mg/dL
HDL: 56 mg/dL
LDL Cholesterol (Calc): 128 mg/dL — ABNORMAL HIGH
Non-HDL Cholesterol (Calc): 143 mg/dL — ABNORMAL HIGH
Total CHOL/HDL Ratio: 3.6 (calc)
Triglycerides: 61 mg/dL

## 2022-12-14 LAB — TSH: TSH: 1.03 m[IU]/L
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
## 2024-01-05 ENCOUNTER — Encounter: Payer: Self-pay | Admitting: Internal Medicine

## 2024-01-05 ENCOUNTER — Ambulatory Visit (INDEPENDENT_AMBULATORY_CARE_PROVIDER_SITE_OTHER): Payer: 59 | Admitting: Internal Medicine

## 2024-01-05 VITALS — BP 140/120 | HR 84 | Ht 60.0 in | Wt 159.0 lb

## 2024-01-05 DIAGNOSIS — E7800 Pure hypercholesterolemia, unspecified: Secondary | ICD-10-CM

## 2024-01-05 DIAGNOSIS — Z8249 Family history of ischemic heart disease and other diseases of the circulatory system: Secondary | ICD-10-CM | POA: Diagnosis not present

## 2024-01-05 DIAGNOSIS — R030 Elevated blood-pressure reading, without diagnosis of hypertension: Secondary | ICD-10-CM

## 2024-01-05 DIAGNOSIS — R5383 Other fatigue: Secondary | ICD-10-CM

## 2024-01-05 DIAGNOSIS — Z0000 Encounter for general adult medical examination without abnormal findings: Secondary | ICD-10-CM

## 2024-01-05 DIAGNOSIS — Z1231 Encounter for screening mammogram for malignant neoplasm of breast: Secondary | ICD-10-CM

## 2024-01-05 LAB — POCT URINALYSIS DIP (CLINITEK)
Bilirubin, UA: NEGATIVE
Blood, UA: NEGATIVE
Glucose, UA: NEGATIVE mg/dL
Ketones, POC UA: NEGATIVE mg/dL
Leukocytes, UA: NEGATIVE
Nitrite, UA: NEGATIVE
POC PROTEIN,UA: NEGATIVE
Spec Grav, UA: 1.01 (ref 1.010–1.025)
Urobilinogen, UA: 0.2 U/dL
pH, UA: 7.5 (ref 5.0–8.0)

## 2024-01-05 NOTE — Patient Instructions (Signed)
 Patient is leaving soon to visit relatives in [place]. She will monitor BP at home as she has a new cuff and will return in a few weeks with readings and OV. She will have fasting labs in the next few days. Is not fasting today. Has GYn physician for GYN care. Vaccines discussed. Declines flu vaccine.

## 2024-01-09 ENCOUNTER — Other Ambulatory Visit: Payer: 59

## 2024-01-09 DIAGNOSIS — E7800 Pure hypercholesterolemia, unspecified: Secondary | ICD-10-CM

## 2024-01-09 DIAGNOSIS — Z0000 Encounter for general adult medical examination without abnormal findings: Secondary | ICD-10-CM

## 2024-01-09 DIAGNOSIS — I499 Cardiac arrhythmia, unspecified: Secondary | ICD-10-CM

## 2024-01-09 DIAGNOSIS — Z1329 Encounter for screening for other suspected endocrine disorder: Secondary | ICD-10-CM

## 2024-01-09 DIAGNOSIS — R5383 Other fatigue: Secondary | ICD-10-CM

## 2024-01-09 DIAGNOSIS — R030 Elevated blood-pressure reading, without diagnosis of hypertension: Secondary | ICD-10-CM

## 2024-01-10 LAB — LIPID PANEL
Cholesterol: 141 mg/dL (ref <200)
HDL: 47 mg/dL — ABNORMAL LOW (ref >=50)
LDL Cholesterol (Calc): 81 mg/dL
Non-HDL Cholesterol (Calc): 94 mg/dL (ref <130)
Total CHOL/HDL Ratio: 3 (calc) (ref <5.0)
Triglycerides: 58 mg/dL (ref <150)

## 2024-01-10 LAB — COMPLETE METABOLIC PANEL WITH GFR
AG Ratio: 1.4 (calc) (ref 1.0–2.5)
ALT: 25 U/L (ref 6–29)
AST: 25 U/L (ref 10–30)
Albumin: 4.1 g/dL (ref 3.6–5.1)
Alkaline phosphatase (APISO): 46 U/L (ref 31–125)
BUN: 14 mg/dL (ref 7–25)
CO2: 24 mmol/L (ref 20–32)
Calcium: 8.9 mg/dL (ref 8.6–10.2)
Chloride: 107 mmol/L (ref 98–110)
Creat: 0.65 mg/dL (ref 0.50–0.99)
Globulin: 2.9 g/dL (ref 1.9–3.7)
Glucose, Bld: 82 mg/dL (ref 65–99)
Potassium: 4.6 mmol/L (ref 3.5–5.3)
Sodium: 138 mmol/L (ref 135–146)
Total Bilirubin: 0.4 mg/dL (ref 0.2–1.2)
Total Protein: 7 g/dL (ref 6.1–8.1)
eGFR: 113 mL/min/{1.73_m2} (ref >=60)

## 2024-01-10 LAB — CBC WITH DIFFERENTIAL/PLATELET
Absolute Lymphocytes: 2904 {cells}/uL (ref 850–3900)
Absolute Monocytes: 710 {cells}/uL (ref 200–950)
Basophils Absolute: 21 {cells}/uL (ref 0–200)
Basophils Relative: 0.4 %
Eosinophils Absolute: 69 {cells}/uL (ref 15–500)
Eosinophils Relative: 1.3 %
HCT: 40.3 % (ref 35.0–45.0)
Hemoglobin: 13.1 g/dL (ref 11.7–15.5)
MCH: 28.9 pg (ref 27.0–33.0)
MCHC: 32.5 g/dL (ref 32.0–36.0)
MCV: 88.8 fL (ref 80.0–100.0)
MPV: 11.3 fL (ref 7.5–12.5)
Monocytes Relative: 13.4 %
Neutro Abs: 1595 {cells}/uL (ref 1500–7800)
Neutrophils Relative %: 30.1 %
Platelets: 271 10*3/uL (ref 140–400)
RBC: 4.54 10*6/uL (ref 3.80–5.10)
RDW: 12.3 % (ref 11.0–15.0)
Total Lymphocyte: 54.8 %
WBC: 5.3 10*3/uL (ref 3.8–10.8)

## 2024-01-10 LAB — TSH: TSH: 1.66 m[IU]/L

## 2024-01-10 LAB — T4, FREE: Free T4: 1 ng/dL (ref 0.8–1.8)

## 2024-03-13 ENCOUNTER — Encounter (HOSPITAL_BASED_OUTPATIENT_CLINIC_OR_DEPARTMENT_OTHER): Payer: Self-pay | Admitting: Radiology

## 2024-03-13 ENCOUNTER — Emergency Department (HOSPITAL_BASED_OUTPATIENT_CLINIC_OR_DEPARTMENT_OTHER)
Admission: EM | Admit: 2024-03-13 | Discharge: 2024-03-13 | Disposition: A | Discharge status: Home / Self Care | Attending: Emergency Medicine | Admitting: Emergency Medicine

## 2024-03-13 ENCOUNTER — Emergency Department (HOSPITAL_BASED_OUTPATIENT_CLINIC_OR_DEPARTMENT_OTHER)

## 2024-03-13 ENCOUNTER — Other Ambulatory Visit: Payer: Self-pay

## 2024-03-13 DIAGNOSIS — R200 Anesthesia of skin: Secondary | ICD-10-CM | POA: Diagnosis present

## 2024-03-13 LAB — CBC WITH DIFFERENTIAL/PLATELET
Abs Immature Granulocytes: 0.01 10*3/uL (ref 0.00–0.07)
Basophils Absolute: 0 10*3/uL (ref 0.0–0.1)
Basophils Relative: 1 %
Eosinophils Absolute: 0.2 10*3/uL (ref 0.0–0.5)
Eosinophils Relative: 3 %
HCT: 42.9 % (ref 36.0–46.0)
Hemoglobin: 14.2 g/dL (ref 12.0–15.0)
Immature Granulocytes: 0 %
Lymphocytes Relative: 44 %
Lymphs Abs: 2.6 10*3/uL (ref 0.7–4.0)
MCH: 28.9 pg (ref 26.0–34.0)
MCHC: 33.1 g/dL (ref 30.0–36.0)
MCV: 87.4 fL (ref 80.0–100.0)
Monocytes Absolute: 0.6 10*3/uL (ref 0.1–1.0)
Monocytes Relative: 11 %
Neutro Abs: 2.4 10*3/uL (ref 1.7–7.7)
Neutrophils Relative %: 41 %
Platelets: 241 10*3/uL (ref 150–400)
RBC: 4.91 MIL/uL (ref 3.87–5.11)
RDW: 12.7 % (ref 11.5–15.5)
WBC: 5.9 10*3/uL (ref 4.0–10.5)
nRBC: 0 % (ref 0.0–0.2)

## 2024-03-13 LAB — BASIC METABOLIC PANEL WITH GFR
Anion gap: 11 (ref 5–15)
BUN: 8 mg/dL (ref 6–20)
CO2: 24 mmol/L (ref 22–32)
Calcium: 9.5 mg/dL (ref 8.9–10.3)
Chloride: 104 mmol/L (ref 98–111)
Creatinine, Ser: 0.81 mg/dL (ref 0.44–1.00)
GFR, Estimated: >60 mL/min (ref >60)
Glucose, Bld: 76 mg/dL (ref 70–99)
Potassium: 4 mmol/L (ref 3.5–5.1)
Sodium: 139 mmol/L (ref 135–145)

## 2024-03-13 LAB — CBG MONITORING, ED: Glucose-Capillary: 80 mg/dL (ref 70–99)

## 2024-03-13 LAB — HCG, SERUM, QUALITATIVE: Preg, Serum: NEGATIVE

## 2024-03-13 MED ORDER — IOHEXOL 350 MG/ML SOLN
100.0000 mL | Freq: Once | INTRAVENOUS | Status: AC | PRN
  Administered 2024-03-13: 75 mL via INTRAVENOUS

## 2024-03-13 NOTE — ED Notes (Signed)
 Ct aware of new # 20 Iv rt ac

## 2024-03-13 NOTE — Discharge Instructions (Signed)
 Workup here today with negative CT negative CT angio head and neck.  Due to the duration of symptoms would recommend follow-up with primary care doctor outpatient MRI may be appropriate.  But acute MRI not indicated.  Also referral information provided to follow-up with hand surgery as well.  Referral information provided to follow-up with [facility] neurology but that may take several weeks before they can get you in.  Work note provided.  Contact your primary care doctor for follow-up later this week or next week.

## 2024-03-13 NOTE — ED Provider Notes (Addendum)
 [facility] Provider Note   CSN: [id number] Arrival date & time: 03/13/24  1002     History  Chief Complaint  Patient presents with   Numbness    Sherry Meza is a 43 y.o. female.  Patient states that there is been left hand numbness from the wrist down involving all fingers for about a week.  Also is associated with a mild headache.  Headache not severe no fevers no visual changes no weakness to the hand no speech problems no numbness anywhere else.  Past medical history noncontributory.  Patient was told that her recent primary care visit couple months ago that blood pressure was a little marginal and she was post to check that.  Blood pressure here is 146/112.  Patient does not have a prior history of hypertension.  Not on any medication for it.  Here temp is 97.7 pulse is 77 respiration 20 oxygen saturation 100% on room air.  Past surgical history significant for cesarean section 2009 uterine fibroid removal in 2022       Home Medications Prior to Admission medications   Medication Sig Start Date End Date Taking? Authorizing Provider  Ascorbic Acid (VITAMIN C PO) Take 1 tablet by mouth daily.    [provider]  Multiple Vitamin (MULTIVITAMIN) tablet Take 1 tablet by mouth daily.    [provider]  NON FORMULARY Prenatal with iron    [provider]  Zinc 25 MG TABS Take by mouth daily.    [provider]      Allergies    Patient has no known allergies.    Review of Systems   Review of Systems  Constitutional:  Negative for chills and fever.  HENT:  Negative for ear pain and sore throat.   Eyes:  Negative for pain and visual disturbance.  Respiratory:  Negative for cough and shortness of breath.   Cardiovascular:  Negative for chest pain and palpitations.  Gastrointestinal:  Negative for abdominal pain and vomiting.  Genitourinary:  Negative for dysuria and hematuria.  Musculoskeletal:   Negative for arthralgias, back pain and neck pain.  Skin:  Negative for color change and rash.  Neurological:  Positive for numbness and headaches. Negative for dizziness, seizures, syncope, facial asymmetry, speech difficulty and weakness.  All other systems reviewed and are negative.   Physical Exam Updated Vital Signs BP (!) 146/112   Pulse 77   Temp 97.7 F (36.5 C)   Resp 20   SpO2 100%  Physical Exam Vitals and nursing note reviewed.  Constitutional:      General: She is not in acute distress.    Appearance: Normal appearance. She is well-developed.  HENT:     Head: Normocephalic and atraumatic.  Eyes:     Extraocular Movements: Extraocular movements intact.     Conjunctiva/sclera: Conjunctivae normal.     Pupils: Pupils are equal, round, and reactive to light.  Cardiovascular:     Rate and Rhythm: Normal rate and regular rhythm.     Heart sounds: No murmur heard. Pulmonary:     Effort: Pulmonary effort is normal. No respiratory distress.     Breath sounds: Normal breath sounds.  Abdominal:     Palpations: Abdomen is soft.     Tenderness: There is no abdominal tenderness.  Musculoskeletal:        General: No swelling.     Cervical back: Normal range of motion and neck supple.     Comments:  Numbness to the left hand.  From the wrist down.  Subjective really motor strength intact.  Radial pulse 2+ good cap refill.  Good range of motion of the fingers wrist elbow and shoulder.  Skin:    General: Skin is warm and dry.     Capillary Refill: Capillary refill takes less than 2 seconds.  Neurological:     General: No focal deficit present.     Mental Status: She is alert and oriented to person, place, and time.     Cranial Nerves: No cranial nerve deficit.     Sensory: Sensory deficit present.     Motor: No weakness.     Comments: Subjective number to all of the left hand wrist down.  Radial pulse 2+ cap refill intact.  Motor strength good.  Psychiatric:        Mood and  Affect: Mood normal.     ED Results / Procedures / Treatments   Labs (all labs ordered are listed, but only abnormal results are displayed) Labs Reviewed  CBC WITH DIFFERENTIAL/PLATELET  BASIC METABOLIC PANEL WITH GFR  CBG MONITORING, ED    EKG EKG Interpretation Date/Time:  Tuesday Mar 13 2024 10:14:18 EDT Ventricular Rate:  75 PR Interval:  145 QRS Duration:  81 QT Interval:  380 QTC Calculation: 425 R Axis:   73  Text Interpretation: Sinus rhythm No previous ECGs available Confirmed by Tearia Gibbs [id number]) on 03/13/2024 10:15:19 AM  Radiology No results found.  Procedures Procedures    Medications Ordered in ED Medications - No data to display  ED Course/ Medical Decision Making/ A&P                                 Medical Decision Making Amount and/or Complexity of Data Reviewed Labs: ordered. Radiology: ordered.  Risk Prescription drug management.   Thumbs atypical for carpal tunnel syndrome because it involves all fingers.  Stroke not completely ruled out but patient's 1 week out from the symptoms.  Certainly no other neurodeficits.  Blood pressure is a little bit elevated here.  Patient may need to keep a logbook of her blood pressure and follow closely back up with primary care doctor.  Patient may require outpatient MRI.  Will get labs will get head CT.  CT head without any acute findings.  CBC is normal base metabolic panel normal including renal function pregnancy test negative.  Blood sugar 80.  Will offer patient CT angio head and neck.  CT angio head and neck without any acute findings.  Other than normal variant on the subclavian artery.  Patient stable for discharge home follow-up with primary care doctor.  Also will give referrals to hand surgery as well as [facility] neurology.   Final Clinical Impression(s) / ED Diagnoses Final diagnoses:  Numbness of left hand    Rx / DC Orders ED Discharge Orders     None         Nicklas Barns, MD 03/13/24 1038    Nicklas Barns, MD 03/13/24 1158    Nicklas Barns, MD 03/13/24 1427    Nicklas Barns, MD 03/13/24 1430

## 2024-03-13 NOTE — ED Triage Notes (Signed)
 Pt states that approximately one week ago she began having decreased sensation in her L hand. Pt also states that she has intermittent HA from time to time, and for the last two days "has felt like one is coming on". Pt denies unilateral weakness, no vision changes, blood thinner use, hx of CVA or DM. No other neuro changes noted during triage.

## 2024-03-15 NOTE — Progress Notes (Addendum)
 Patient Care Team: Sylvan Evener, MD as PCP - General (Internal Medicine) Bridgette Campus, MD as PCP - Cardiology (Cardiology)  Visit Date: 03/16/24  Subjective:   Chief Complaint  Patient presents with   Hospitalization Follow-up   Vitals:   03/16/24 1129 03/16/24 1146  BP: (!) 150/102 (!) 160/100   Patient UE:AVWUJW Apodaca,Female DOB:03-14-1981,43 y.o. JXB:[id number]   43 y.o. Female presents today for ED follow-up from 03/13/2024 for Left Hand Numbness. Presented to the ED with numbness in her left hand from the wrist involving all fingers and a mild headache for the past week. Motor strength and ROM were intact, radial pulse 2+, and 2+ cap refill. BP was 146/112, other vitals normal. EKG normal. CBC and B-MET normal. CT head normal and CT angio, head and neck also normal with visualization of normal variation of subclavian artery. Discharged with referrals to [facility] Neurology and hand surgery. Today she says that she has been having this numbness in her whole hand for about 1 or 2 weeks, some days more noticeable than others. 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  Elevated Blood Pressure today at 150/102 initially, 160/100 on recheck. She says that at home this morning her blood pressures were normal at ~122/80. When seen back in February 2025, blood pressure was also elevated.   Hx of palpitations seen by Dr. Amanda Jungling, Cardiologist in 2023 thought to be low risk for ischemic disease. Had ! Run of V-tach 5 beats and 9 runs of SVT longest being 1 minute 3 seconds.    No Known Allergies  Past Surgical History:  Procedure Laterality Date   CESAREAN SECTION  2009   uterine fibroid removed  2022   Family History  Problem Relation Age of Onset   Diabetes Father    Hypertension Father    Heart disease Paternal Grandmother    Social Hx: native of [place]. Came to [place] in [place] when she was 43 years old. Single. Completed 4 years of college. Resides with  teenage daughter. Works for [employer].     Review of Systems  Neurological:        (+) Numbness/Reduced Sensation, left hand palmar  All other systems reviewed and are negative.    Objective:  Vitals: BP (!) 160/100 (BP Location: Left Arm, Patient Position: Sitting, Cuff Size: Normal)   Pulse 92   Temp 98 F (36.7 C) (Temporal)   Ht 5' (1.524 m)   Wt 146 lb 6.4 oz (66.4 kg)   LMP 03/08/2024   SpO2 99%   BMI 28.59 kg/m   Physical Exam Vitals and nursing note reviewed.  Constitutional:      General: She is not in acute distress.    Appearance: Normal appearance. She is not toxic-appearing.  HENT:     Head: Normocephalic and atraumatic.  Cardiovascular:     Rate and Rhythm: Normal rate and regular rhythm. No extrasystoles are present.    Pulses: Normal pulses.     Heart sounds: Normal heart sounds. No murmur heard.    No friction rub. No gallop.  Pulmonary:     Effort: Pulmonary effort is normal. No respiratory distress.     Breath sounds: Normal breath sounds. No wheezing or rales.  Skin:    General: Skin is warm and dry.  Neurological:     General: No focal deficit present.     Mental Status: She is alert and oriented to person, place, and time. Mental status is at baseline.  Motor: No weakness.     Coordination: Coordination is intact.     Deep Tendon Reflexes:     Reflex Scores:      Brachioradialis reflexes are 2+ on the left side.    Comments: Patient describes numbness within palmar aspect With left hand extended back began feeling numbness more within her 2nd, 3rd, and 4th fingers, describes a reduced sensation.    Psychiatric:        Mood and Affect: Mood normal.        Behavior: Behavior normal.        Thought Content: Thought content normal.        Judgment: Judgment normal.    Results:  Studies Obtained And Personally Reviewed By Me: Labs:     Component Value Date/Time   NA 139 03/13/2024 1054   K 4.0 03/13/2024 1054   CL 104 03/13/2024 1054    CO2 24 03/13/2024 1054   GLUCOSE 76 03/13/2024 1054   BUN 8 03/13/2024 1054   CREATININE 0.81 03/13/2024 1054   CREATININE 0.65 01/09/2024 1120   CALCIUM 9.5 03/13/2024 1054   PROT 7.0 01/09/2024 1120   AST 25 01/09/2024 1120   ALT 25 01/09/2024 1120   BILITOT 0.4 01/09/2024 1120   GFRNONAA >60 03/13/2024 1054    Lab Results  Component Value Date   WBC 5.9 03/13/2024   HGB 14.2 03/13/2024   HCT 42.9 03/13/2024   MCV 87.4 03/13/2024   PLT 241 03/13/2024   Lab Results  Component Value Date   CHOL 141 01/09/2024   HDL 47 (L) 01/09/2024   LDLCALC 81 01/09/2024   TRIG 58 01/09/2024   CHOLHDL 3.0 01/09/2024   Lab Results  Component Value Date   TSH 1.66 01/09/2024    Assessment & Plan:  ED follow-up from 03/13/2024 for Left Hand Numbness: presented to the ED with numbness in her left hand from the wrist involving all fingers and a mild headache for the past week. Motor strength and ROM were intact, radial pulse 2+, and 2+ cap refill. BP was 146/112, other vitals normal. EKG normal. CBC and B-MET normal. CT head normal and CT angio, head and neck also normal with visualization of normal variation of subclavian artery.   Discharged from ED with referrals to [facility] Neurology and hand surgery. Today says that she has been having this numbness in her whole hand for about 1 or 2 weeks, some days more noticeable than others. 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   Suspect carpal tunnel syndrome left hand.   On exam left brachioradialis reflex normal, with hand dorsiflexed, she described more numbness within her 2nd, 3rd, and 4th fingers but still a reduced sensation in her whole palm and fingers.Phalen's sign is positive. Tinel's sign negative  Elevated Blood Pressure today at 150/102 initially, 160/100 on recheck.  Likely has essential HTN. Certainly has family history of HTN. She says that at home this morning her blood pressures were normal at ~122/80. When seen back in  February 2025, and blood pressure was also elevated. Agrees to start low dose losartan 25 mg daily and follow up here in 4 weeks. BP readings have been elevated here previously.    I,Emily Lagle,acting as a Neurosurgeon for Sylvan Evener, MD.,have documented all relevant documentation on the behalf of Sylvan Evener, MD,as directed by  Sylvan Evener, MD while in the presence of Sylvan Evener, MD.   I, Sylvan Evener, MD, have reviewed all  documentation for this visit. The documentation on 03/16/24 for the exam, diagnosis, procedures, and orders are all accurate and complete.

## 2024-03-16 ENCOUNTER — Encounter: Payer: Self-pay | Admitting: Internal Medicine

## 2024-03-16 ENCOUNTER — Ambulatory Visit: Admitting: Internal Medicine

## 2024-03-16 VITALS — BP 160/100 | HR 92 | Temp 98.0°F | Ht 60.0 in | Wt 146.4 lb

## 2024-03-16 DIAGNOSIS — R202 Paresthesia of skin: Secondary | ICD-10-CM | POA: Diagnosis not present

## 2024-03-16 DIAGNOSIS — I10 Essential (primary) hypertension: Secondary | ICD-10-CM | POA: Diagnosis not present

## 2024-03-16 DIAGNOSIS — G5602 Carpal tunnel syndrome, left upper limb: Secondary | ICD-10-CM

## 2024-03-16 MED ORDER — LOSARTAN POTASSIUM 25 MG PO TABS: 25.0000 mg | ORAL_TABLET | Freq: Every day | ORAL | 0 refills | Status: AC

## 2024-03-16 NOTE — Patient Instructions (Addendum)
 Referral to Dr. Marce Sensing (hand surgeon) for evaluation of left hand numbness. May have carpal tunnel syndrome.   Also, BP elevated today and in ED. Family Hx of hypertension. Start losartan 25 mg daily and return  here for followup  in 4 weeks.  Also ED has placed Neurology referral.

## 2024-03-28 NOTE — Progress Notes (Incomplete)
   Patient Care Team: Sylvan Evener, MD as PCP - General (Internal Medicine) Bridgette Campus, MD (Inactive) as PCP - Cardiology (Cardiology)  Visit Date: 04/12/24  Subjective:  No chief complaint on file.  There were no vitals filed for this visit. Patient Sherry Meza,Female DOB:[birth year],43 y.o. MRN:[id number]   43 y.o.Femalepresents today for 1 month follow-up for Hypertension. Patient has a past medical history of Family Hx of Hypertension and Heart Disease. Seen 03/16/2024 after being seen in the ED for left hand numbness, She was started on Losartan  25 mg daily as her blood pressure has been consistently elevated for multiple office visits.     No past medical history on file.  No Known Allergies  Family History  Problem Relation Age of Onset   Diabetes Father    Hypertension Father    Heart disease Paternal Grandmother    Social History   Social History Narrative   She is a native of [place].  She came to this country in 1996 when she was 43 years old.  She is single.  Completed 4 years of college.   Resides with teenage daughter.  She works for [employer].   ROS   Objective:  Vitals: There were no vitals taken for this visit.  Physical Exam  Results:  Studies Obtained And Personally Reviewed By Me:  {Imaging; Colonoscopy, Mammogram; DEXA; Echocardiogram; Heart Cath; Stress Test; CT Coronary Calcium Scoring; etc:32292}  Labs:     Component Value Date/Time   NA 139 03/13/2024 1054   K 4.0 03/13/2024 1054   CL 104 03/13/2024 1054   CO2 24 03/13/2024 1054   GLUCOSE 76 03/13/2024 1054   BUN 8 03/13/2024 1054   CREATININE 0.81 03/13/2024 1054   CREATININE 0.65 01/09/2024 1120   CALCIUM 9.5 03/13/2024 1054   PROT 7.0 01/09/2024 1120   AST 25 01/09/2024 1120   ALT 25 01/09/2024 1120   BILITOT 0.4 01/09/2024 1120   GFRNONAA >60 03/13/2024 1054    Lab Results  Component Value Date   WBC 5.9 03/13/2024   HGB 14.2 03/13/2024   HCT 42.9 03/13/2024   MCV 87.4  03/13/2024   PLT 241 03/13/2024   Lab Results  Component Value Date   CHOL 141 01/09/2024   HDL 47 (L) 01/09/2024   LDLCALC 81 01/09/2024   TRIG 58 01/09/2024   CHOLHDL 3.0 01/09/2024   No results found for: "HGBA1C"  Lab Results  Component Value Date   TSH 1.66 01/09/2024    {PSA (Optional):32132} No results found for any visits on 04/16/24. Assessment & Plan:   ***        I,Emily Lagle,acting as a scribe for Sylvan Evener, MD.,have documented all relevant documentation on the behalf of Sylvan Evener, MD,as directed by  Sylvan Evener, MD while in the presence of Sylvan Evener, MD.   ***

## 2024-04-16 ENCOUNTER — Ambulatory Visit: Admitting: Internal Medicine

## 2024-04-16 ENCOUNTER — Other Ambulatory Visit (INDEPENDENT_AMBULATORY_CARE_PROVIDER_SITE_OTHER): Payer: Self-pay

## 2024-04-16 ENCOUNTER — Ambulatory Visit: Admitting: Orthopedic Surgery

## 2024-04-16 DIAGNOSIS — R200 Anesthesia of skin: Secondary | ICD-10-CM

## 2024-04-16 NOTE — Progress Notes (Signed)
 Sherry Meza - 43 y.o. female MRN [id number]  Date of birth: [birth year]  Office Visit Note: Visit Date: 04/16/2024 PCP: Sylvan Evener, MD Referred by: Sylvan Evener, MD  Subjective: No chief complaint on file.  HPI: Sherry Meza is a pleasant 43 y.o. female who presents today for evaluation of ongoing left hand numbness.  She states that the symptoms have been progressive over the past few months and are becoming more noticeable.  She describing symptoms both at daytime and at nighttime.  Has not undergone any formalized workup or treatment at this time.  She is overall healthy and active at baseline.  Pertinent ROS were reviewed with the patient and found to be negative unless otherwise specified above in HPI.   Visit Reason:Left hand numbness  Duration of symptoms: 2-3 months Hand dominance: right Occupation: Production designer, theatre/television/film at [employer] Diabetic: No Smoking: No Heart/Lung History: none Blood Thinners: none  Prior Testing/EMG:none Injections (Date):none Treatments:none Prior Surgery:none  Assessment & Plan: Visit Diagnoses:  1. Numbness of left hand     Plan: Extensive discussion was had with the patient today about her left hand complaints.  Based on clinical examination today, there is concern for potential carpal tunnel syndrome.  I did explain to her the underlying etiology and pathophysiology of this condition as well as appropriate workup and potential treatments.  We discussed conservative versus surgical treatment modalities.  From a conservative standpoint, we discussed activity modification, bracing, occupational therapy for nerve gliding exercises as well as nonsteroidal anti-inflammatory medication both topical and oral.  From a surgical standpoint, we discussed open versus endoscopic carpal tunnel release, forms of anesthesia as well as the appropriate postoperative protocol.  From a workup standpoint, I did explain to her that I will like to obtain electrodiagnostic  study of left upper extremity in order to better understand pathology and potential sites of nerve compression.  In the meantime, she will be fitted for a left wrist brace to be utilized as needed, I did explain that she can utilize this at night for nocturnal symptoms in particular.  In addition, we will begin occupational therapy for nerve gliding exercises in order to look for potential relief.  Once the electrodiagnostic study is complete of the left upper extremity, we will review the results and discuss if the conservative treatment modalities are working.  Follow-up: No follow-ups on file.   Meds & Orders: No orders of the defined types were placed in this encounter.   Orders Placed This Encounter  Procedures   XR Wrist Complete Left   Ambulatory referral to Physical Medicine Rehab     Procedures: No procedures performed      Clinical History: No specialty comments available.  She reports that she has never smoked. She has never used smokeless tobacco. No results for input(s): "HGBA1C", "LABURIC" in the last 8760 hours.  Objective:   Vital Signs: There were no vitals taken for this visit.  Physical Exam  Gen: Well-appearing, in no acute distress; non-toxic CV: Regular Rate. Well-perfused. Warm.  Resp: Breathing unlabored on room air; no wheezing. Psych: Fluid speech in conversation; appropriate affect; normal thought process  Ortho Exam PHYSICAL EXAM:  General: Patient is well appearing and in no distress.   Skin and Muscle: No significant skin changes are apparent to upper extremities.   Range of Motion and Palpation Tests: Mobility is full about the elbows with flexion and extension. Forearm supination and pronation are 85/85 bilaterally.  Wrist flexion/extension is 75/65 bilaterally.  Digital flexion and extension are full.  Thumb opposition is full to the base of the small fingers bilaterally.    No cords or nodules are palpated.  No triggering is observed.    No  significant tenderness over the thumb CMC articulation bilateral is observed.  CMC grind is negative bilaterally.   No evidence of radiocarpal, midcarpal or intercarpal joint instability with provocation bilaterally.  Neurologic, Vascular, Motor: Sensation is diminished to light touch in the left median distribution.    Thenar atrophy: Negative bilateral Tinel sign: Positive left carpal tunnel Carpal tunnel compression: Positive left Phalen test: Positive left  Sensory bilateral hand 2-point discrimination (thumb, index, middle): Left 7-8 mm, right 5 mm  Motor bilateral hand FPL: 5/5 Index FDP: 5/5 APB: 5/5  Fingers pink and well perfused.  Capillary refill is brisk.     No results found for: "HGBA1C"   Imaging: XR Wrist Complete Left Result Date: 04/16/2024 There is no evidence of fracture or dislocation. There is no evidence of arthropathy or other focal bone abnormality. Soft tissues are unremarkable.  Slight ulnar positivity without evidence of clear impaction    Past Medical/Family/Surgical/Social History: Medications & Allergies reviewed per EMR, new medications updated. There are no active problems to display for this patient.  No past medical history on file. Family History  Problem Relation Age of Onset   Diabetes Father    Hypertension Father    Heart disease Paternal Grandmother    Past Surgical History:  Procedure Laterality Date   CESAREAN SECTION  2009   uterine fibroid removed  2022   Social History   Occupational History   Not on file  Tobacco Use   Smoking status: Never   Smokeless tobacco: Never  Substance and Sexual Activity   Alcohol use: Not on file   Drug use: Not on file   Sexual activity: Not on file    Lavern Crimi Merlinda Starling) Marce Sensing, M.D. [facility], Hand Surgery

## 2024-09-10 ENCOUNTER — Encounter: Payer: Self-pay | Admitting: Radiology

## 2024-12-31 ENCOUNTER — Other Ambulatory Visit: Payer: 59

## 2025-01-07 ENCOUNTER — Encounter: Payer: 59 | Admitting: Internal Medicine
